# Patient Record
Sex: Male | Born: 1995 | Race: Black or African American | Hispanic: No | Marital: Single | Smoking: Former smoker
Health system: Southern US, Community
[De-identification: ages and names within clinical notes are randomized; demographics above are authoritative.]

## PROBLEM LIST (undated history)

## (undated) DIAGNOSIS — G47419 Narcolepsy without cataplexy: Secondary | ICD-10-CM

---
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
## 2017-01-19 ENCOUNTER — Emergency Department: Payer: Self-pay

## 2017-01-19 ENCOUNTER — Encounter: Payer: Self-pay | Admitting: Emergency Medicine

## 2017-01-19 ENCOUNTER — Emergency Department
Admission: EM | Admit: 2017-01-19 | Discharge: 2017-01-19 | Disposition: A | Discharge status: Home / Self Care | Payer: Self-pay | Attending: Emergency Medicine | Admitting: Emergency Medicine

## 2017-01-19 DIAGNOSIS — M546 Pain in thoracic spine: Secondary | ICD-10-CM | POA: Insufficient documentation

## 2017-01-19 DIAGNOSIS — F17210 Nicotine dependence, cigarettes, uncomplicated: Secondary | ICD-10-CM | POA: Insufficient documentation

## 2017-01-19 DIAGNOSIS — M545 Low back pain: Secondary | ICD-10-CM | POA: Insufficient documentation

## 2017-01-19 MED ORDER — NAPROXEN 500 MG PO TABS: 500.0000 mg | ORAL_TABLET | Freq: Two times a day (BID) | ORAL | 0 refills | Status: AC

## 2017-01-19 MED ORDER — CYCLOBENZAPRINE HCL 5 MG PO TABS: 5.0000 mg | ORAL_TABLET | Freq: Three times a day (TID) | ORAL | 0 refills | Status: DC | PRN

## 2017-01-19 MED ORDER — NAPROXEN 500 MG PO TABS
500.0000 mg | ORAL_TABLET | Freq: Once | ORAL | Status: AC
  Administered 2017-01-19: 500 mg via ORAL
  Filled 2017-01-19: qty 1

## 2017-01-19 NOTE — Discharge Instructions (Signed)
Your exam and x-rays are negative for any acute fracture or dislocation. You do have some mild scoliosis. 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 You should take the prescription meds as directed. Follow-up with your provider for continued symptoms.

## 2017-01-19 NOTE — ED Notes (Signed)
Pt states that yesterday while at work he lifted a heavy box out of the chiller, pt states that he has been having left sided back pain since, pt denies this being worker's comp at this time, pt denies pain radiating into his buttocks or leg. Pt states that when he bends forward he feels the pain in his left lower back, pt denies pain or issue with urinating. No distress noted at this time

## 2017-01-19 NOTE — ED Provider Notes (Signed)
[facility] Emergency Department Provider Note ____________________________________________  Time seen: 2121  I have reviewed the triage vital signs and the nursing notes.  HISTORY  Chief Complaint  Back Pain  HPI Stephen [name] is a 21 y.o. male Presents to the ED for evaluation of intermittent mid back pain has been persistent according to him "since I was little." He reports thoracic pain with lifting boxes at work. He denies any trauma, accident, falls, or contusions. He also denies any chest pain, shortness or breath, or fevers.   History reviewed. No pertinent past medical history.  There are no active problems to display for this patient.  History reviewed. No pertinent surgical history.  Prior to Admission medications   Medication Sig Start Date End Date Taking? Authorizing Provider  cyclobenzaprine (FLEXERIL) 5 MG tablet Take 1 tablet (5 mg total) by mouth 3 (three) times daily as needed for muscle spasms. 01/19/17   Ayeshia Coppin, Charlesetta Ivory, PA-C  naproxen (NAPROSYN) 500 MG tablet Take 1 tablet (500 mg total) by mouth 2 (two) times daily with a meal. 01/19/17 02/18/17  Lailie Smead, Charlesetta Ivory, PA-C    Allergies Patient has no known allergies.  History reviewed. No pertinent family history.  Social History Social History  Substance Use Topics  . Smoking status: Current Every Day Smoker  . Smokeless tobacco: Never Used  . Alcohol use No    Review of Systems  Constitutional: Negative for fever. Cardiovascular: Negative for chest pain. Respiratory: Negative for shortness of breath. Gastrointestinal: Negative for abdominal pain, vomiting and diarrhea. Genitourinary: Negative for dysuria. Musculoskeletal: Negative for back pain. Midback pain as above.  Skin: Negative for rash. Neurological: Negative for headaches, focal weakness or numbness. ____________________________________________  PHYSICAL EXAM:  VITAL SIGNS: ED Triage Vitals  [01/19/17 2005]  Enc Vitals Group     BP 115/60     Pulse Rate 88     Resp 16     Temp 98.6 F (37 C)     Temp Source Oral     SpO2 100 %     Weight 138 lb (62.6 kg)     Height  (1.727 m)     Head Circumference      Peak Flow      Pain Score 5     Pain Loc      Pain Edu?      Excl. in GC?     Constitutional: Alert and oriented. Well appearing and in no distress. Head: Normocephalic and atraumatic. Eyes: Conjunctivae are normal. Normal extraocular movements Neck: Supple. No thyromegaly. Cardiovascular: Normal rate, regular rhythm. Normal distal pulses. Respiratory: Normal respiratory effort. No wheezes/rales/rhonchi. Gastrointestinal: Soft and nontender. No distention. Musculoskeletal: Nontender midline spinal alignment with right convex curvature consistent with a thoracolumbar scoliosis. Mildly tender to palpation over left thoracolumbar musculature. No palpable spasm noted. Nontender with normal range of motion in all extremities.  Neurologic:  Normal gait without ataxia. Normal speech and language. No gross focal neurologic deficits are appreciated. Skin:  Skin is warm, dry and intact. No rash noted. ____________________________________________   RADIOLOGY  Thoracic Spine IMPRESSION: 1. Thoracic and lumbar curvature. 2. No acute findings or significant degenerative change.  I, Berniece Abid, Charlesetta Ivory, personally viewed and evaluated these images (plain radiographs) as part of my medical decision making, as well as reviewing the written report by the radiologist. ____________________________________________  PROCEDURES  Naproxen 500 mg PO ____________________________________________  INITIAL IMPRESSION / ASSESSMENT AND PLAN / ED COURSE  Patient  with ED evaluation of intermittent mid back pain and muscle strain. His symptoms have been evaluated by his work activities. No acute trauma suspected. His x-rays negative for any acute car pulmonary process. It does  reveal some thoracolumbar scoliosis as well as 2 well-inflated lungs. Patient will be discharged with a prescription for naproxen and Flexeril to dose as needed. He should follow-up with his primary care provider or local community clinic for ongoing symptom management. ____________________________________________  FINAL CLINICAL IMPRESSION(S) / ED DIAGNOSES  Final diagnoses:  Thoracolumbar back pain      Kaloni Bisaillon, Charlesetta Ivory, PA-C 01/19/17 2302    Dionne Bucy, MD 01/20/17 [id number]

## 2017-01-19 NOTE — ED Triage Notes (Signed)
Pt states he has had off and on back "problems since I was little". Pt states over last several days has had mid thoracic to low thoracic pain with lifting and then "stopping". Pt denies fever, dysuria, bowel or bladder difficulties. Pt appears in no acute distress and is ambulatory without difficulty.

## 2017-02-05 ENCOUNTER — Emergency Department
Admission: EM | Admit: 2017-02-05 | Discharge: 2017-02-05 | Disposition: A | Discharge status: Home / Self Care | Payer: Self-pay | Attending: Emergency Medicine | Admitting: Emergency Medicine

## 2017-02-05 ENCOUNTER — Encounter: Payer: Self-pay | Admitting: Emergency Medicine

## 2017-02-05 DIAGNOSIS — R112 Nausea with vomiting, unspecified: Secondary | ICD-10-CM | POA: Insufficient documentation

## 2017-02-05 DIAGNOSIS — R197 Diarrhea, unspecified: Secondary | ICD-10-CM | POA: Insufficient documentation

## 2017-02-05 DIAGNOSIS — Z5321 Procedure and treatment not carried out due to patient leaving prior to being seen by health care provider: Secondary | ICD-10-CM | POA: Insufficient documentation

## 2017-02-05 LAB — URINALYSIS, COMPLETE (UACMP) WITH MICROSCOPIC
BACTERIA UA: NONE SEEN
BILIRUBIN URINE: NEGATIVE
Glucose, UA: NEGATIVE mg/dL
HGB URINE DIPSTICK: NEGATIVE
Ketones, ur: NEGATIVE mg/dL
LEUKOCYTES UA: NEGATIVE
NITRITE: NEGATIVE
PROTEIN: NEGATIVE mg/dL
RBC / HPF: NONE SEEN RBC/hpf (ref 0–5)
Specific Gravity, Urine: 1.024 (ref 1.005–1.030)
pH: 7 (ref 5.0–8.0)

## 2017-02-05 LAB — CBC
HEMATOCRIT: 45.4 % (ref 40.0–52.0)
HEMOGLOBIN: 15.1 g/dL (ref 13.0–18.0)
MCH: 28.6 pg (ref 26.0–34.0)
MCHC: 33.3 g/dL (ref 32.0–36.0)
MCV: 86.1 fL (ref 80.0–100.0)
Platelets: 222 10*3/uL (ref 150–440)
RBC: 5.27 MIL/uL (ref 4.40–5.90)
RDW: 13.5 % (ref 11.5–14.5)
WBC: 8.3 10*3/uL (ref 3.8–10.6)

## 2017-02-05 LAB — COMPREHENSIVE METABOLIC PANEL
ALBUMIN: 4.5 g/dL (ref 3.5–5.0)
ALT: 20 U/L (ref 17–63)
ANION GAP: 7 (ref 5–15)
AST: 28 U/L (ref 15–41)
Alkaline Phosphatase: 61 U/L (ref 38–126)
BILIRUBIN TOTAL: 0.9 mg/dL (ref 0.3–1.2)
BUN: 14 mg/dL (ref 6–20)
CHLORIDE: 105 mmol/L (ref 101–111)
CO2: 29 mmol/L (ref 22–32)
Calcium: 9.5 mg/dL (ref 8.9–10.3)
Creatinine, Ser: 1.06 mg/dL (ref 0.61–1.24)
GFR calc Af Amer: >60 mL/min (ref >60)
GFR calc non Af Amer: >60 mL/min (ref >60)
GLUCOSE: 84 mg/dL (ref 65–99)
POTASSIUM: 3.9 mmol/L (ref 3.5–5.1)
SODIUM: 141 mmol/L (ref 135–145)
TOTAL PROTEIN: 8.1 g/dL (ref 6.5–8.1)

## 2017-02-05 LAB — LIPASE, BLOOD: LIPASE: 31 U/L (ref 11–51)

## 2017-02-05 NOTE — ED Triage Notes (Signed)
Pt c/o N/V/D x1 day. Pt reports he started to have symptoms after eating a burger from [employer] last night.

## 2017-02-05 NOTE — ED Notes (Signed)
Pt to front desk to inform that he is leaving due to having to get to work. Pt advised to stay and be seen. Pt refusing to stay.

## 2018-09-17 ENCOUNTER — Emergency Department
Admission: EM | Admit: 2018-09-17 | Discharge: 2018-09-17 | Disposition: A | Discharge status: Home / Self Care | Payer: Self-pay | Attending: Emergency Medicine | Admitting: Emergency Medicine

## 2018-09-17 ENCOUNTER — Other Ambulatory Visit: Payer: Self-pay

## 2018-09-17 ENCOUNTER — Emergency Department: Payer: Self-pay

## 2018-09-17 ENCOUNTER — Encounter: Payer: Self-pay | Admitting: *Deleted

## 2018-09-17 DIAGNOSIS — F17200 Nicotine dependence, unspecified, uncomplicated: Secondary | ICD-10-CM | POA: Insufficient documentation

## 2018-09-17 DIAGNOSIS — Y999 Unspecified external cause status: Secondary | ICD-10-CM | POA: Insufficient documentation

## 2018-09-17 DIAGNOSIS — Y929 Unspecified place or not applicable: Secondary | ICD-10-CM | POA: Insufficient documentation

## 2018-09-17 DIAGNOSIS — Y9389 Activity, other specified: Secondary | ICD-10-CM | POA: Insufficient documentation

## 2018-09-17 DIAGNOSIS — S6991XA Unspecified injury of right wrist, hand and finger(s), initial encounter: Secondary | ICD-10-CM | POA: Insufficient documentation

## 2018-09-17 MED ORDER — IBUPROFEN 600 MG PO TABS: 600.0000 mg | ORAL_TABLET | Freq: Four times a day (QID) | ORAL | 0 refills | Status: DC | PRN

## 2018-09-17 MED ORDER — KETOROLAC TROMETHAMINE 30 MG/ML IJ SOLN
30.0000 mg | Freq: Once | INTRAMUSCULAR | Status: AC
  Administered 2018-09-17: 30 mg via INTRAMUSCULAR
  Filled 2018-09-17: qty 1

## 2018-09-17 NOTE — ED Triage Notes (Signed)
Pt has pain in right wrist.  Pt was playing tug of war today and injured wrist   Pt alert

## 2018-09-17 NOTE — ED Provider Notes (Signed)
[facility] Emergency Department Provider Note  ____________________________________________  Time seen: Approximately 10:00 PM  I have reviewed the triage vital signs and the nursing notes.   HISTORY  Chief Complaint Wrist Pain    HPI Stephen [name] is a 23 y.o. male presents emergency department for evaluation of right wrist pain after injury today.  Patient was playing tug of war on his bike with a fan friend when he fell off of his bike.  He landed with his hands outstretched.  He has been having pain to his right wrist since.  He states that he occasionally gets some shooting pains from his wrist into his hands.  He has not taken anything for pain.  He did not hit his head.  No additional injuries.   No past medical history on file.  There are no active problems to display for this patient.   No past surgical history on file.  Prior to Admission medications   Medication Sig Start Date End Date Taking? Authorizing Provider  cyclobenzaprine (FLEXERIL) 5 MG tablet Take 1 tablet (5 mg total) by mouth 3 (three) times daily as needed for muscle spasms. 01/19/17   Menshew, Charlesetta IvoryJenise V Bacon, PA-C  ibuprofen (ADVIL) 600 MG tablet Take 1 tablet (600 mg total) by mouth every 6 (six) hours as needed. 09/17/18   Enid DerryWagner, Lizzy Hamre, PA-C    Allergies Patient has no known allergies.  No family history on file.  Social History Social History   Tobacco Use  . Smoking status: Current Every Day Smoker  . Smokeless tobacco: Never Used  Substance Use Topics  . Alcohol use: No  . Drug use: No     Review of Systems  Respiratory: No SOB. Gastrointestinal:  No nausea, no vomiting.  Musculoskeletal: Positive for wrist pain. Skin: Negative for rash, abrasions, lacerations, ecchymosis.   ____________________________________________   PHYSICAL EXAM:  VITAL SIGNS: ED Triage Vitals  Enc Vitals Group     BP 09/17/18 1841 116/74     Pulse Rate 09/17/18 1841 (!)  109     Resp 09/17/18 1841 18     Temp 09/17/18 1841 98.8 F (37.1 C)     Temp Source 09/17/18 1841 Oral     SpO2 09/17/18 1841 99 %     Weight 09/17/18 1842 150 lb (68 kg)     Height 09/17/18 1842 5\' 8"  (1.727 m)     Head Circumference --      Peak Flow --      Pain Score 09/17/18 1841 9     Pain Loc --      Pain Edu? --      Excl. in GC? --      Constitutional: Alert and oriented. Well appearing and in no acute distress.  Patient eating graham crackers and peanut butter. Eyes: Conjunctivae are normal. PERRL. EOMI. Head: Atraumatic. ENT:      Ears:      Nose: No congestion/rhinnorhea.      Mouth/Throat: Mucous membranes are moist.  Neck: No stridor.   Cardiovascular: Normal rate, regular rhythm.  Good peripheral circulation.  Symmetric radial pulses bilaterally.  Cap refill less than 2 seconds. Respiratory: Normal respiratory effort without tachypnea or retractions. Lungs CTAB. Good air entry to the bases with no decreased or absent breath sounds. Musculoskeletal: Full range of motion to all extremities. No gross deformities appreciated.  Full range of motion of right wrist.  No visible swelling.  No pinpoint tenderness to palpation. Neurologic:  Normal  speech and language. No gross focal neurologic deficits are appreciated.  Skin:  Skin is warm, dry and intact. No rash noted. Psychiatric: Mood and affect are normal. Speech and behavior are normal. Patient exhibits appropriate insight and judgement.   ____________________________________________   LABS (all labs ordered are listed, but only abnormal results are displayed)  Labs Reviewed - No data to display ____________________________________________  EKG   ____________________________________________  RADIOLOGY Lexine Baton, personally viewed and evaluated these images (plain radiographs) as part of my medical decision making, as well as reviewing the written report by the radiologist.  Dg Wrist Complete  Right  Result Date: 09/17/2018 CLINICAL DATA:  Wrist pain following tug of war, initial encounter EXAM: RIGHT WRIST - COMPLETE 3+ VIEW COMPARISON:  None. FINDINGS: There is no evidence of fracture or dislocation. There is no evidence of arthropathy or other focal bone abnormality. Soft tissues are unremarkable. IMPRESSION: No acute abnormality noted. Electronically Signed   By: Alcide Clever M.D.   On: 09/17/2018 20:41    ____________________________________________    PROCEDURES  Procedure(s) performed:    Procedures    Medications  ketorolac (TORADOL) 30 MG/ML injection 30 mg (30 mg Intramuscular Given 09/17/18 2110)     ____________________________________________   INITIAL IMPRESSION / ASSESSMENT AND PLAN / ED COURSE  Pertinent labs & imaging results that were available during my care of the patient were reviewed by me and considered in my medical decision making (see chart for details).  Review of the [place] CSRS was performed in accordance of the [facility] prior to dispensing any controlled drugs.     Patient presents emergency department for evaluation of right wrist pain after injury today.  Vital signs and exam are reassuring.  X-ray negative for acute bony abnormalities.  Velcro wrist splint was applied.  IM Toradol was given for pain.  Patient will be discharged home with prescriptions for Motrin. Patient is to follow up with primary care as directed. Patient is given ED precautions to return to the ED for any worsening or new symptoms.     ____________________________________________  FINAL CLINICAL IMPRESSION(S) / ED DIAGNOSES  Final diagnoses:  Injury of right wrist, initial encounter      NEW MEDICATIONS STARTED DURING THIS VISIT:  ED Discharge Orders         Ordered    ibuprofen (ADVIL) 600 MG tablet  Every 6 hours PRN     09/17/18 2057              This chart was dictated using voice recognition software/Dragon. Despite best efforts to proofread,  errors can occur which can change the meaning. Any change was purely unintentional.    Enid Derry, PA-C 09/17/18 2300    Jene Every, MD 09/17/18 2308

## 2018-09-17 NOTE — Discharge Instructions (Signed)
There are no broken bones in your wrist.  Please ice and elevate wrist tonight.  You were given a shot of pain medication in the emergency department.  Tomorrow, begin ibuprofen for pain and inflammation.  Please wear Velcro wrist splint.  Follow-up with primary care next week for reevaluation.

## 2018-11-06 ENCOUNTER — Other Ambulatory Visit: Payer: Self-pay

## 2018-11-06 DIAGNOSIS — Z20822 Contact with and (suspected) exposure to covid-19: Secondary | ICD-10-CM

## 2018-11-10 LAB — NOVEL CORONAVIRUS, NAA: SARS-CoV-2, NAA: NOT DETECTED

## 2019-02-01 ENCOUNTER — Ambulatory Visit: Payer: Self-pay

## 2019-02-02 ENCOUNTER — Encounter: Payer: Self-pay | Admitting: Physician Assistant

## 2019-02-02 ENCOUNTER — Ambulatory Visit: Payer: Self-pay | Admitting: Physician Assistant

## 2019-02-02 ENCOUNTER — Other Ambulatory Visit: Payer: Self-pay

## 2019-02-02 DIAGNOSIS — Z202 Contact with and (suspected) exposure to infections with a predominantly sexual mode of transmission: Secondary | ICD-10-CM

## 2019-02-02 DIAGNOSIS — Z113 Encounter for screening for infections with a predominantly sexual mode of transmission: Secondary | ICD-10-CM

## 2019-02-02 LAB — GRAM STAIN

## 2019-02-02 MED ORDER — AZITHROMYCIN 500 MG PO TABS
1000.0000 mg | ORAL_TABLET | Freq: Once | ORAL | Status: AC
  Administered 2019-02-02: 1000 mg via ORAL

## 2019-02-02 NOTE — Progress Notes (Signed)
    STI clinic/screening visit  Subjective:  Stephen [name] is a 23 y.o. male being seen today for an STI screening visit. The patient reports they do have symptoms.  Patient has the following medical conditions:  There are no active problems to display for this patient.    Chief Complaint  Patient presents with  . SEXUALLY TRANSMITTED DISEASE    HPI  Patient reports that he is a contact to Chlamydia.  States that he has had some grayish discharge for a day or so and "feels funny" when he urinates.  See flowsheet for further details and programmatic requirements.    The following portions of the patient's history were reviewed and updated as appropriate: allergies, current medications, past medical history, past social history, past surgical history and problem list.  Objective:  There were no vitals filed for this visit.  Physical Exam Constitutional:      General: He is not in acute distress.    Appearance: Normal appearance.  HENT:     Head: Normocephalic and atraumatic.     Mouth/Throat:     Mouth: Mucous membranes are moist.     Pharynx: Oropharynx is clear. No oropharyngeal exudate or posterior oropharyngeal erythema.  Eyes:     Conjunctiva/sclera: Conjunctivae normal.  Neck:     Musculoskeletal: Neck supple.  Pulmonary:     Effort: Pulmonary effort is normal.  Abdominal:     Palpations: Abdomen is soft. There is no mass.     Tenderness: There is no abdominal tenderness. There is no guarding or rebound.  Genitourinary:    Penis: Normal.      Scrotum/Testes: Normal.     Comments: Pubic area without nits, lice, edema, erythema, lesions, and inguinal adenopathy. Penis circumcised and without discharge from the meatus. Lymphadenopathy:     Cervical: No cervical adenopathy.  Skin:    General: Skin is warm and dry.     Findings: No bruising, erythema, lesion or rash.  Neurological:     Mental Status: He is alert and oriented to person, place, and time.   Psychiatric:        Mood and Affect: Mood normal.        Behavior: Behavior normal.        Thought Content: Thought content normal.        Judgment: Judgment normal.       Assessment and Plan:  Stephen [name] is a 23 y.o. male presenting to the [facility] Department for STI screening  1. Screening for STD (sexually transmitted disease) Patient with symptoms.   Rec condoms with all sex. Await test results.  Counseled that RN will call if needs to RTC for further treatment once results are back. - Gram stain - Gonococcus culture - HIV [facility] - Syphilis Serology, [facility] - Gonococcus culture  2. Exposure to chlamydia Patient to be treated as a contact to Chlamydia with Azithromycin 1g po DOT today. No sex for 7 days and until after partner completes treatment. RTC if vomits < 2 hr after completing medicine for re-treatment. - azithromycin (ZITHROMAX) tablet 1,000 mg     No follow-ups on file.  No future appointments.  Jerene Dilling, PA

## 2019-02-05 NOTE — Progress Notes (Signed)
Gram stain reviewed and pt treated for NGU and contact to Chlamydia per SO and per Antoine Primas, PA order with Azithromycin 1g po DOT. Provider orders completed.Ronny Bacon, RN

## 2019-02-07 LAB — GONOCOCCUS CULTURE

## 2019-02-22 ENCOUNTER — Encounter: Payer: Self-pay | Admitting: Emergency Medicine

## 2019-02-22 ENCOUNTER — Other Ambulatory Visit: Payer: Self-pay

## 2019-02-22 DIAGNOSIS — Z20828 Contact with and (suspected) exposure to other viral communicable diseases: Secondary | ICD-10-CM | POA: Insufficient documentation

## 2019-02-22 DIAGNOSIS — R05 Cough: Secondary | ICD-10-CM | POA: Insufficient documentation

## 2019-02-22 NOTE — ED Triage Notes (Signed)
Patient ambulatory to triage with steady gait, without difficulty or distress noted, mask in place; pt reports sore throat & congestion x wk; denies fever

## 2019-02-23 ENCOUNTER — Emergency Department
Admission: EM | Admit: 2019-02-23 | Discharge: 2019-02-23 | Disposition: A | Discharge status: Home / Self Care | Payer: Self-pay | Attending: Emergency Medicine | Admitting: Emergency Medicine

## 2019-02-23 ENCOUNTER — Emergency Department: Payer: Self-pay

## 2019-02-23 DIAGNOSIS — R05 Cough: Secondary | ICD-10-CM

## 2019-02-23 DIAGNOSIS — Z20822 Contact with and (suspected) exposure to covid-19: Secondary | ICD-10-CM

## 2019-02-23 DIAGNOSIS — R059 Cough, unspecified: Secondary | ICD-10-CM

## 2019-02-23 LAB — SARS CORONAVIRUS 2 (TAT 6-24 HRS): SARS Coronavirus 2: NEGATIVE

## 2019-02-23 MED ORDER — PREDNISONE 20 MG PO TABS: 60.0000 mg | ORAL_TABLET | Freq: Every day | ORAL | 0 refills | Status: AC

## 2019-02-23 MED ORDER — PREDNISONE 20 MG PO TABS
60.0000 mg | ORAL_TABLET | Freq: Once | ORAL | Status: AC
  Administered 2019-02-23: 60 mg via ORAL
  Filled 2019-02-23: qty 3

## 2019-02-23 NOTE — Discharge Instructions (Signed)

## 2019-02-23 NOTE — ED Provider Notes (Signed)
[facility] Emergency Department Provider Note  ____________________________________________  Time seen: Approximately 1:11 AM  I have reviewed the triage vital signs and the nursing notes.   HISTORY  Chief Complaint Sore Throat   HPI STYLIANOS STRADLING is a 23 y.o. male no significant past medical history who presents for evaluation of cough and sore throat.  Patient reports a week of congestion, cough productive of clear sputum, sore throat, bilateral earaches.  No body aches, no fever, no chest pain, no shortness of breath, no vomiting, no diarrhea.  Patient works in [employer] but has had no known exposures to Covid.  He is concerned about Covid.  He is a smoker.  Also smokes marijuana.  Denies any drug use.  His symptoms are mild and constant.   PMH ADHD (attention deficit hyperactivity disorder)   Mild mental retardation      Prior to Admission medications   Medication Sig Start Date End Date Taking? Authorizing Provider  cyclobenzaprine (FLEXERIL) 5 MG tablet Take 1 tablet (5 mg total) by mouth 3 (three) times daily as needed for muscle spasms. 01/19/17   Menshew, Dannielle Karvonen, PA-C  ibuprofen (ADVIL) 600 MG tablet Take 1 tablet (600 mg total) by mouth every 6 (six) hours as needed. 09/17/18   Laban Emperor, PA-C  predniSONE (DELTASONE) 20 MG tablet Take 3 tablets (60 mg total) by mouth daily for 4 days. 02/23/19 02/27/19  Rudene Re, MD    Allergies Patient has no known allergies.  No family history on file.  Social History Smoking - current Drugs - MJ Alcohol - no  Review of Systems  Constitutional: Negative for fever. Eyes: Negative for visual changes. ENT: + sore throat, b/l ear pain Neck: No neck pain  Cardiovascular: Negative for chest pain. Respiratory: Negative for shortness of breath. + cough Gastrointestinal: Negative for abdominal pain, vomiting or diarrhea. Genitourinary: Negative for dysuria. Musculoskeletal:  Negative for back pain. Skin: Negative for rash. Neurological: Negative for headaches, weakness or numbness. Psych: No SI or HI  ____________________________________________   PHYSICAL EXAM:  VITAL SIGNS: ED Triage Vitals [02/22/19 2350]  Enc Vitals Group     BP 120/72     Pulse Rate 80     Resp 18     Temp 98.5 F (36.9 C)     Temp Source Oral     SpO2 99 %     Weight 149 lb (67.6 kg)     Height 5\' 8"  (1.727 m)     Head Circumference      Peak Flow      Pain Score 8     Pain Loc      Pain Edu?      Excl. in [place]?     Constitutional: Alert and oriented. Well appearing and in no apparent distress. HEENT:      Head: Normocephalic and atraumatic.         Eyes: Conjunctivae are normal. Sclera is non-icteric.       Mouth/Throat: Mucous membranes are moist.  Oropharynx is clear with no exudates, no erythema, no swelling.      Ears: Bilateral TMs are visualized and normal with no exudate.      Neck: Supple with no signs of meningismus. Cardiovascular: Regular rate and rhythm. No murmurs, gallops, or rubs. 2+ symmetrical distal pulses are present in all extremities. No JVD. Respiratory: Normal respiratory effort. Lungs are clear to auscultation bilaterally. No wheezes, crackles, or rhonchi.  Gastrointestinal: Soft, non tender,  and non distended with positive bowel sounds. No rebound or guarding. Musculoskeletal: Nontender with normal range of motion in all extremities. No edema, cyanosis, or erythema of extremities. Neurologic: Normal speech and language. Face is symmetric. Moving all extremities. No gross focal neurologic deficits are appreciated. Skin: Skin is warm, dry and intact. No rash noted. Psychiatric: Mood and affect are normal. Speech and behavior are normal.  ____________________________________________   LABS (all labs ordered are listed, but only abnormal results are displayed)  Labs Reviewed  SARS CORONAVIRUS 2 (TAT 6-24 HRS)    ____________________________________________  EKG  I have personally reviewed the images performed during this visit and I agree with the Radiologist's read.   Interpretation by Radiologist:  Dg Chest Portable 1 View  Result Date: 02/23/2019 CLINICAL DATA:  Cough EXAM: PORTABLE CHEST 1 VIEW COMPARISON:  None. FINDINGS: The heart size and mediastinal contours are within normal limits. Both lungs are clear. The visualized skeletal structures are unremarkable. IMPRESSION: No active disease. Electronically Signed   By: Katherine Mantle M.D.   On: 02/23/2019 01:22      ____________________________________________  RADIOLOGY    ____________________________________________   PROCEDURES  Procedure(s) performed: None Procedures Critical Care performed:  None ____________________________________________   INITIAL IMPRESSION / ASSESSMENT AND PLAN / ED COURSE  23 y.o. male no significant past medical history who presents for evaluation of cough, sore throat and earache x1 week.  Patient is concerned that he is Covid positive.  Will swab him for Covid.  Patient with normal work of breathing, normal sats at rest and ambulation, lungs are clear to auscultation, no shortness of breath or chest pain.  Chest x-ray negative for pneumonia.  Discussed quarantine and home care until results of Covid are back and also discussed quarantine and home care if the results are positive. Will dc patient on prednisone for possible bronchitis with cough on a smoker.        As part of my medical decision making, I reviewed the following data within the electronic MEDICAL RECORD NUMBER Nursing notes reviewed and incorporated, Old chart reviewed, Radiograph reviewed , Notes from prior ED visits and [place] Controlled Substance Database   Patient was evaluated in Emergency Department today for the symptoms described in the history of present illness. Patient was evaluated in the context of the global COVID-19  pandemic, which necessitated consideration that the patient might be at risk for infection with the SARS-CoV-2 virus that causes COVID-19. Institutional protocols and algorithms that pertain to the evaluation of patients at risk for COVID-19 are in a state of rapid change based on information released by regulatory bodies including the CDC and federal and state organizations. These policies and algorithms were followed during the patient's care in the ED.   ____________________________________________   FINAL CLINICAL IMPRESSION(S) / ED DIAGNOSES   Final diagnoses:  Cough  Suspected COVID-19 virus infection      NEW MEDICATIONS STARTED DURING THIS VISIT:  ED Discharge Orders         Ordered    predniSONE (DELTASONE) 20 MG tablet  Daily     02/23/19 0134           Note:  This document was prepared using Dragon voice recognition software and may include unintentional dictation errors.    Don Perking, [place], MD 02/23/19 [phone]

## 2019-02-23 NOTE — ED Notes (Signed)
Patient discharged to home per MD order. Patient in stable condition, and deemed medically cleared by ED provider for discharge. Discharge instructions reviewed with patient/family using "Teach Back"; verbalized understanding of medication education and administration, and information about follow-up care. Denies further concerns. ° °

## 2019-03-16 ENCOUNTER — Other Ambulatory Visit: Payer: Self-pay

## 2019-03-16 ENCOUNTER — Emergency Department
Admission: EM | Admit: 2019-03-16 | Discharge: 2019-03-16 | Disposition: A | Discharge status: Home / Self Care | Payer: Self-pay | Attending: Emergency Medicine | Admitting: Emergency Medicine

## 2019-03-16 DIAGNOSIS — Z7689 Persons encountering health services in other specified circumstances: Secondary | ICD-10-CM | POA: Insufficient documentation

## 2019-03-16 DIAGNOSIS — F17200 Nicotine dependence, unspecified, uncomplicated: Secondary | ICD-10-CM | POA: Insufficient documentation

## 2019-03-16 NOTE — ED Triage Notes (Signed)
Pt reports he went to work this am and got hot and had a pain hit him in the abdomen. Pt reports that he feels fine now and was told to come get a work note. Pt denies pain. In no distress.

## 2019-03-16 NOTE — ED Provider Notes (Signed)
[facility] Emergency Department Provider Note  ____________________________________________  Time seen: Approximately 6:41 PM  I have reviewed the triage vital signs and the nursing notes.   HISTORY  Chief Complaint Nausea and Letter for School/Work    HPI Stephen [name] is a 23 y.o. male presents to the emergency department for a return to work note.  Patient states that he became nauseated at work yesterday and had to leave early.  His employer is requiring a return to work note.  He reports that he does not currently feel sick and has had no nausea or vomiting.  He has been afebrile.  No rhinorrhea, nasal congestion or nonproductive cough.        No past medical history on file.  There are no active problems to display for this patient.   No past surgical history on file.  Prior to Admission medications   Medication Sig Start Date End Date Taking? Authorizing Provider  cyclobenzaprine (FLEXERIL) 5 MG tablet Take 1 tablet (5 mg total) by mouth 3 (three) times daily as needed for muscle spasms. 01/19/17   Menshew, Dannielle Karvonen, PA-C  ibuprofen (ADVIL) 600 MG tablet Take 1 tablet (600 mg total) by mouth every 6 (six) hours as needed. 09/17/18   Laban Emperor, PA-C    Allergies Patient has no known allergies.  No family history on file.  Social History Social History   Tobacco Use  . Smoking status: Current Every Day Smoker  . Smokeless tobacco: Never Used  Substance Use Topics  . Alcohol use: No  . Drug use: No     Review of Systems  Constitutional: No fever/chills Eyes: No visual changes. No discharge ENT: No upper respiratory complaints. Cardiovascular: no chest pain. Respiratory: no cough. No SOB. Gastrointestinal: No abdominal pain.  No nausea, no vomiting.  No diarrhea.  No constipation. Genitourinary: Negative for dysuria. No hematuria Musculoskeletal: Negative for musculoskeletal pain. Skin: Negative for rash, abrasions,  lacerations, ecchymosis. Neurological: Negative for headaches, focal weakness or numbness. ____________________________________________   PHYSICAL EXAM:  VITAL SIGNS: ED Triage Vitals  Enc Vitals Group     BP 03/16/19 1420 118/71     Pulse Rate 03/16/19 1420 (!) 110     Resp 03/16/19 1420 16     Temp 03/16/19 1420 98.9 F (37.2 C)     Temp Source 03/16/19 1420 Oral     SpO2 03/16/19 1420 97 %     Weight 03/16/19 1421 150 lb (68 kg)     Height 03/16/19 1421 5\' 8"  (1.727 m)     Head Circumference --      Peak Flow --      Pain Score 03/16/19 1421 0     Pain Loc --      Pain Edu? --      Excl. in [place]? --      Constitutional: Alert and oriented. Well appearing and in no acute distress. Eyes: Conjunctivae are normal. PERRL. EOMI. Head: Atraumatic. ENT:      Nose: No congestion/rhinnorhea.      Mouth/Throat: Mucous membranes are moist.  Neck: No stridor.  No cervical spine tenderness to palpation. Cardiovascular: Normal rate, regular rhythm. Normal S1 and S2.  Good peripheral circulation. Respiratory: Normal respiratory effort without tachypnea or retractions. Lungs CTAB. Good air entry to the bases with no decreased or absent breath sounds. Gastrointestinal: Bowel sounds 4 quadrants. Soft and nontender to palpation. No guarding or rigidity. No palpable masses. No distention. No CVA tenderness.  Musculoskeletal: Full range of motion to all extremities. No gross deformities appreciated. Neurologic:  Normal speech and language. No gross focal neurologic deficits are appreciated.  Skin:  Skin is warm, dry and intact. No rash noted. Psychiatric: Mood and affect are normal. Speech and behavior are normal. Patient exhibits appropriate insight and judgement.   ____________________________________________   LABS (all labs ordered are listed, but only abnormal results are displayed)  Labs Reviewed - No data to  display ____________________________________________  EKG   ____________________________________________  RADIOLOGY   No results found.  ____________________________________________    PROCEDURES  Procedure(s) performed:    Procedures    Medications - No data to display   ____________________________________________   INITIAL IMPRESSION / ASSESSMENT AND PLAN / ED COURSE  Pertinent labs & imaging results that were available during my care of the patient were reviewed by me and considered in my medical decision making (see chart for details).  Review of the [place] CSRS was performed in accordance of the [facility] prior to dispensing any controlled drugs.           Assessment and plan Return to work evaluation 23 year old male presents to the emergency department for a return to work note.  Request was granted.  All patient questions were answered.     ____________________________________________  FINAL CLINICAL IMPRESSION(S) / ED DIAGNOSES  Final diagnoses:  Return to work evaluation      NEW MEDICATIONS STARTED DURING THIS VISIT:  ED Discharge Orders    None          This chart was dictated using voice recognition software/Dragon. Despite best efforts to proofread, errors can occur which can change the meaning. Any change was purely unintentional.    Orvil Feil, PA-C 03/16/19 Cyndie Chime, MD 03/16/19 [phone]

## 2019-06-24 ENCOUNTER — Other Ambulatory Visit: Payer: Self-pay

## 2019-06-24 ENCOUNTER — Encounter: Payer: Self-pay | Admitting: Intensive Care

## 2019-06-24 ENCOUNTER — Emergency Department: Payer: No Typology Code available for payment source

## 2019-06-24 ENCOUNTER — Emergency Department
Admission: EM | Admit: 2019-06-24 | Discharge: 2019-06-24 | Disposition: A | Discharge status: Home / Self Care | Payer: No Typology Code available for payment source | Attending: Emergency Medicine | Admitting: Emergency Medicine

## 2019-06-24 DIAGNOSIS — Y9389 Activity, other specified: Secondary | ICD-10-CM | POA: Insufficient documentation

## 2019-06-24 DIAGNOSIS — M25562 Pain in left knee: Secondary | ICD-10-CM | POA: Insufficient documentation

## 2019-06-24 DIAGNOSIS — M545 Low back pain: Secondary | ICD-10-CM | POA: Diagnosis not present

## 2019-06-24 DIAGNOSIS — Y92410 Unspecified street and highway as the place of occurrence of the external cause: Secondary | ICD-10-CM | POA: Diagnosis not present

## 2019-06-24 DIAGNOSIS — Y999 Unspecified external cause status: Secondary | ICD-10-CM | POA: Diagnosis not present

## 2019-06-24 DIAGNOSIS — F17210 Nicotine dependence, cigarettes, uncomplicated: Secondary | ICD-10-CM | POA: Insufficient documentation

## 2019-06-24 DIAGNOSIS — M542 Cervicalgia: Secondary | ICD-10-CM | POA: Diagnosis not present

## 2019-06-24 HISTORY — DX: Narcolepsy without cataplexy: G47.419

## 2019-06-24 MED ORDER — MELOXICAM 15 MG PO TABS: 15.0000 mg | ORAL_TABLET | Freq: Every day | ORAL | 1 refills | Status: AC

## 2019-06-24 MED ORDER — METHOCARBAMOL 500 MG PO TABS: 500.0000 mg | ORAL_TABLET | Freq: Three times a day (TID) | ORAL | 0 refills | Status: AC | PRN
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
## 2019-06-24 NOTE — ED Triage Notes (Signed)
EMS vitals 130/85 b/p, 86HR, 95% RA
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
## 2019-08-30 ENCOUNTER — Encounter: Payer: Self-pay | Admitting: Physician Assistant

## 2019-08-30 ENCOUNTER — Ambulatory Visit: Payer: Self-pay | Admitting: Physician Assistant

## 2019-08-30 ENCOUNTER — Other Ambulatory Visit: Payer: Self-pay

## 2019-08-30 DIAGNOSIS — Z792 Long term (current) use of antibiotics: Secondary | ICD-10-CM

## 2019-08-30 DIAGNOSIS — Z113 Encounter for screening for infections with a predominantly sexual mode of transmission: Secondary | ICD-10-CM

## 2019-08-30 LAB — GRAM STAIN

## 2019-08-30 MED ORDER — AZITHROMYCIN 500 MG PO TABS
1000.0000 mg | ORAL_TABLET | Freq: Once | ORAL | Status: AC
  Administered 2019-08-30: 1000 mg via ORAL

## 2019-08-30 NOTE — Progress Notes (Signed)
Patient gram stain reviewed with provider, patient treated with azithromycin per provider orders. Patient counseled that [facility] will call if any abnormal test results, and that patient should call back if symptoms return. Patient states understanding.Burt Knack, RN

## 2019-08-30 NOTE — Progress Notes (Signed)
Patient here for STD testing.Messiah Ahr Brewer-Jensen, RN 

## 2019-08-30 NOTE — Progress Notes (Signed)
[facility] Department STI clinic/screening visit  Subjective:  Stephen [name] is a 24 y.o. male being seen today for an STI screening visit. The patient reports they do have symptoms.    Patient has the following medical conditions:  There are no problems to display for this patient.    Chief Complaint  Patient presents with  . SEXUALLY TRANSMITTED DISEASE    screening    HPI  Patient reports that he had a small amount of yellowish/whitish discharge from his penis on 08/27/2019.  States that he has not seen any further discharge, but that "it feels like icy/hot down there," both internally and externally.  Denies other symptoms, chronic conditions, history of surgery and regular medications.  Patient last voided about 45 min prior to exam.    See flowsheet for further details and programmatic requirements.    The following portions of the patient's history were reviewed and updated as appropriate: allergies, current medications, past medical history, past social history, past surgical history and problem list.  Objective:  There were no vitals filed for this visit.  Physical Exam Constitutional:      General: He is not in acute distress.    Appearance: Normal appearance.  HENT:     Head: Normocephalic and atraumatic.     Comments: No nits, lice, or hair loss. No cervical, supraclavicular or axillary adenopathy.    Mouth/Throat:     Mouth: Mucous membranes are moist.     Pharynx: Oropharynx is clear. No oropharyngeal exudate or posterior oropharyngeal erythema.  Eyes:     Conjunctiva/sclera: Conjunctivae normal.  Pulmonary:     Effort: Pulmonary effort is normal.  Abdominal:     Palpations: Abdomen is soft. There is no mass.     Tenderness: There is no abdominal tenderness. There is no guarding or rebound.  Genitourinary:    Penis: Normal.      Testes: Normal.     Comments: Pubic area without nits, lice, edema, erythema, lesions and inguinal  adenopathy. Penis circumcised and without rash or lesions.  No discharge visualized at meatus on exam. Musculoskeletal:     Cervical back: Neck supple. No tenderness.  Skin:    General: Skin is warm and dry.     Findings: No bruising, erythema, lesion or rash.  Neurological:     Mental Status: He is alert and oriented to person, place, and time.  Psychiatric:        Mood and Affect: Mood normal.        Behavior: Behavior normal.        Thought Content: Thought content normal.        Judgment: Judgment normal.       Assessment and Plan:  Stephen [name] is a 24 y.o. male presenting to the [facility] Department for STI screening  1. Screening for STD (sexually transmitted disease) Patient into clinic with symptoms. Rec condoms with all sex. Await test results.  Counseled that RN will call if needs to RTC for further treatment once results are back.  - Gram stain - Gonococcus culture - HIV [facility] - Syphilis Serology, [facility]  2. Prophylactic antibiotic Due to patient symptoms will treat with Azithromycin 1g po  DOT today. No sex for 7 days and until after partner has been evaluated and treated. RTC for re-treatment if vomits < 2 hr after taking medicine.  - azithromycin (ZITHROMAX) tablet 1,000 mg     No follow-ups on file.  No future appointments.  Jerene Dilling, PA

## 2019-09-04 LAB — GONOCOCCUS CULTURE
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
## 2020-09-26 ENCOUNTER — Emergency Department
Admission: EM | Admit: 2020-09-26 | Discharge: 2020-09-26 | Disposition: A | Discharge status: Home / Self Care | Payer: Self-pay | Attending: Emergency Medicine | Admitting: Emergency Medicine

## 2020-09-26 ENCOUNTER — Other Ambulatory Visit: Payer: Self-pay

## 2020-09-26 ENCOUNTER — Encounter: Payer: Self-pay | Admitting: Emergency Medicine

## 2020-09-26 DIAGNOSIS — G44209 Tension-type headache, unspecified, not intractable: Secondary | ICD-10-CM | POA: Insufficient documentation

## 2020-09-26 DIAGNOSIS — F17210 Nicotine dependence, cigarettes, uncomplicated: Secondary | ICD-10-CM | POA: Insufficient documentation

## 2020-09-26 DIAGNOSIS — R5383 Other fatigue: Secondary | ICD-10-CM | POA: Insufficient documentation

## 2020-09-26 DIAGNOSIS — M545 Low back pain, unspecified: Secondary | ICD-10-CM | POA: Insufficient documentation

## 2020-09-26 MED ORDER — KETOROLAC TROMETHAMINE 60 MG/2ML IM SOLN
30.0000 mg | Freq: Once | INTRAMUSCULAR | Status: AC
  Administered 2020-09-26: 30 mg via INTRAMUSCULAR
  Filled 2020-09-26: qty 2

## 2020-09-26 MED ORDER — NAPROXEN 500 MG PO TABS: 500.0000 mg | ORAL_TABLET | Freq: Two times a day (BID) | ORAL | 0 refills | Status: AC

## 2020-09-26 NOTE — ED Triage Notes (Signed)
C/O headache and back pain x 2 days.  Also c/o fatigue and sweating when sleeping.  AAAOx3.  Skin warm and dry. NAD

## 2020-09-26 NOTE — ED Notes (Signed)
See triage note  Presents with 2-3 days no fever no n/v  States min to relief with tylenol

## 2020-09-26 NOTE — ED Provider Notes (Signed)
[facility] Emergency Department Provider Note   ____________________________________________   Event Date/Time   First MD Initiated Contact with Patient 09/26/20 1059     (approximate)  I have reviewed the triage vital signs and the nursing notes.   HISTORY  Chief Complaint Headache    HPI Stephen [name] is a 25 y.o. male with no significant past medical history who presents to the ED complaining of headache.  Patient reports that he has had gradually worsening throbbing headache starting near both temples for the past 2 days.  He states it will radiate around both sides towards the back of his head, and is not exacerbated or alleviated by anything.  He denies any associated nausea, vomiting, or photophobia.  He has not had any vision changes, speech changes, numbness, or weakness.  He denies any fevers or neck stiffness.  He denies any history of similar headaches, took Tylenol yesterday without significant relief.  He does state that he has been feeling slightly fatigued with some soreness in his lower back, denies any trauma to his neck or back.        Past Medical History:  Diagnosis Date  . Narcolepsy     There are no problems to display for this patient.   History reviewed. No pertinent surgical history.  Prior to Admission medications   Medication Sig Start Date End Date Taking? Authorizing Provider  naproxen (NAPROSYN) 500 MG tablet Take 1 tablet (500 mg total) by mouth 2 (two) times daily with a meal for 6 days. 09/26/20 10/02/20 Yes Chesley Noon, MD    Allergies Patient has no known allergies.  No family history on file.  Social History Social History   Tobacco Use  . Smoking status: Current Every Day Smoker    Packs/day: 0.25    Years: 6.00    Pack years: 1.50    Types: Cigarettes  . Smokeless tobacco: Never Used  Vaping Use  . Vaping Use: Some days  Substance Use Topics  . Alcohol use: Yes  . Drug use: Yes    Types:  Marijuana    Review of Systems  Constitutional: No fever/chills Eyes: No visual changes. ENT: No sore throat. Cardiovascular: Denies chest pain. Respiratory: Denies shortness of breath. Gastrointestinal: No abdominal pain.  No nausea, no vomiting.  No diarrhea.  No constipation. Genitourinary: Negative for dysuria. Musculoskeletal: Positive for back pain. Skin: Negative for rash. Neurological: Positive for headaches, negative for focal weakness or numbness.  ____________________________________________   PHYSICAL EXAM:  VITAL SIGNS: ED Triage Vitals  Enc Vitals Group     BP 09/26/20 1052 113/78     Pulse Rate 09/26/20 1052 89     Resp 09/26/20 1051 16     Temp 09/26/20 1051 98.8 F (37.1 C)     Temp Source 09/26/20 1051 Oral     SpO2 09/26/20 1052 97 %     Weight 09/26/20 1051 175 lb 0.7 oz (79.4 kg)     Height 09/26/20 1051 6' (1.829 m)     Head Circumference --      Peak Flow --      Pain Score 09/26/20 1051 10     Pain Loc --      Pain Edu? --      Excl. in GC? --     Constitutional: Alert and oriented. Eyes: Conjunctivae are normal.  Pupils equal round and reactive to light bilaterally. Head: Atraumatic. Nose: No congestion/rhinnorhea. Mouth/Throat: Mucous membranes are moist. Neck: Normal  ROM Cardiovascular: Normal rate, regular rhythm. Grossly normal heart sounds. Respiratory: Normal respiratory effort.  No retractions. Lungs CTAB. Gastrointestinal: Soft and nontender. No distention. Genitourinary: deferred Musculoskeletal: No lower extremity tenderness nor edema.  No midline cervical, thoracic, or lumbar spinal tenderness to palpation. Neurologic:  Normal speech and language. No gross focal neurologic deficits are appreciated. Skin:  Skin is warm, dry and intact. No rash noted. Psychiatric: Mood and affect are normal. Speech and behavior are normal.  ____________________________________________   LABS (all labs ordered are listed, but only abnormal  results are displayed)  Labs Reviewed - No data to display   PROCEDURES  Procedure(s) performed (including Critical Care):  Procedures   ____________________________________________   INITIAL IMPRESSION / ASSESSMENT AND PLAN / ED COURSE       25 year old male with no significant past medical history presents to the ED complaining of bilateral throbbing headache gradually worsening over the past 2 days, starting in his temples and radiating backwards in a bandlike distribution.  Symptoms sound most consistent with a tension headache, no findings to suggest SAH or meningitis.  I would also consider viral syndrome given patient's fatigue and lower back discomfort.  There are no findings to suggest cauda equina, patient neurovascular intact to bilateral lower extremities.  Vital signs are reassuring and patient denies any difficulty breathing.  He is appropriate for discharge home and we will treat symptomatically with IM Toradol, patient counseled to continue anti-inflammatories at home.  He was counseled to return to the ED for new worsening symptoms, patient agrees with plan.      ____________________________________________   FINAL CLINICAL IMPRESSION(S) / ED DIAGNOSES  Final diagnoses:  Tension headache     ED Discharge Orders         Ordered    naproxen (NAPROSYN) 500 MG tablet  2 times daily with meals        09/26/20 1132           Note:  This document was prepared using Dragon voice recognition software and may include unintentional dictation errors.   Chesley Noon, MD 09/26/20 1135

## 2021-11-21 ENCOUNTER — Emergency Department (HOSPITAL_COMMUNITY): Payer: Self-pay

## 2021-11-21 ENCOUNTER — Emergency Department (HOSPITAL_COMMUNITY)
Admission: EM | Admit: 2021-11-21 | Discharge: 2021-11-21 | Disposition: A | Discharge status: Home / Self Care | Payer: Self-pay | Attending: Emergency Medicine | Admitting: Emergency Medicine

## 2021-11-21 ENCOUNTER — Encounter (HOSPITAL_COMMUNITY): Payer: Self-pay | Admitting: Emergency Medicine

## 2021-11-21 ENCOUNTER — Other Ambulatory Visit: Payer: Self-pay

## 2021-11-21 DIAGNOSIS — R519 Headache, unspecified: Secondary | ICD-10-CM | POA: Insufficient documentation

## 2021-11-21 DIAGNOSIS — R110 Nausea: Secondary | ICD-10-CM | POA: Insufficient documentation

## 2021-11-21 LAB — CBC WITH DIFFERENTIAL/PLATELET
Abs Immature Granulocytes: 0.03 10*3/uL (ref 0.00–0.07)
Basophils Absolute: 0.1 10*3/uL (ref 0.0–0.1)
Basophils Relative: 1 %
Eosinophils Absolute: 0.2 10*3/uL (ref 0.0–0.5)
Eosinophils Relative: 2 %
HCT: 48.4 % (ref 39.0–52.0)
Hemoglobin: 16.7 g/dL (ref 13.0–17.0)
Immature Granulocytes: 0 %
Lymphocytes Relative: 17 %
Lymphs Abs: 2.1 10*3/uL (ref 0.7–4.0)
MCH: 28.8 pg (ref 26.0–34.0)
MCHC: 34.5 g/dL (ref 30.0–36.0)
MCV: 83.6 fL (ref 80.0–100.0)
Monocytes Absolute: 0.7 10*3/uL (ref 0.1–1.0)
Monocytes Relative: 6 %
Neutro Abs: 9 10*3/uL — ABNORMAL HIGH (ref 1.7–7.7)
Neutrophils Relative %: 74 %
Platelets: 266 10*3/uL (ref 150–400)
RBC: 5.79 MIL/uL (ref 4.22–5.81)
RDW: 12.3 % (ref 11.5–15.5)
WBC: 12.1 10*3/uL — ABNORMAL HIGH (ref 4.0–10.5)
nRBC: 0 % (ref 0.0–0.2)

## 2021-11-21 LAB — BASIC METABOLIC PANEL
Anion gap: 8 (ref 5–15)
BUN: 14 mg/dL (ref 6–20)
CO2: 28 mmol/L (ref 22–32)
Calcium: 9 mg/dL (ref 8.9–10.3)
Chloride: 102 mmol/L (ref 98–111)
Creatinine, Ser: 1.21 mg/dL (ref 0.61–1.24)
GFR, Estimated: >60 mL/min (ref >60)
Glucose, Bld: 107 mg/dL — ABNORMAL HIGH (ref 70–99)
Potassium: 3.7 mmol/L (ref 3.5–5.1)
Sodium: 138 mmol/L (ref 135–145)

## 2021-11-21 MED ORDER — KETOROLAC TROMETHAMINE 30 MG/ML IJ SOLN
15.0000 mg | Freq: Once | INTRAMUSCULAR | Status: AC
  Administered 2021-11-21: 15 mg via INTRAVENOUS
  Filled 2021-11-21: qty 1

## 2021-11-21 MED ORDER — DIPHENHYDRAMINE HCL 50 MG/ML IJ SOLN
50.0000 mg | Freq: Once | INTRAMUSCULAR | Status: AC
  Administered 2021-11-21: 50 mg via INTRAVENOUS
  Filled 2021-11-21: qty 1

## 2021-11-21 MED ORDER — DEXAMETHASONE SODIUM PHOSPHATE 10 MG/ML IJ SOLN
10.0000 mg | Freq: Once | INTRAMUSCULAR | Status: AC
  Administered 2021-11-21: 10 mg via INTRAVENOUS
  Filled 2021-11-21: qty 1

## 2021-11-21 MED ORDER — SODIUM CHLORIDE 0.9 % IV BOLUS
1000.0000 mL | Freq: Once | INTRAVENOUS | Status: AC
  Administered 2021-11-21: 1000 mL via INTRAVENOUS

## 2021-11-21 MED ORDER — PROCHLORPERAZINE EDISYLATE 10 MG/2ML IJ SOLN
10.0000 mg | Freq: Once | INTRAMUSCULAR | Status: AC
  Administered 2021-11-21: 10 mg via INTRAVENOUS
  Filled 2021-11-21: qty 2

## 2021-11-21 NOTE — ED Provider Notes (Signed)
[facility] EMERGENCY DEPARTMENT Provider Note   CSN: [id number] Arrival date & time: 11/21/21  0147     History  Chief Complaint  Patient presents with   Headache    Stephen [name] is a 26 y.o. male.  Patient presents for bad headache.  Symptoms have been present for a couple of days.  Patient has had persistent headache across the front of his head accompanied by nausea, light sensitivity.  He has had similar headaches in the past but has never had a real work-up for them.       Home Medications Prior to Admission medications   Not on File      Allergies    Patient has no known allergies.    Review of Systems   Review of Systems  Physical Exam Updated Vital Signs BP 128/82   Pulse 81   Temp 98.4 F (36.9 C)   Resp 16   Ht 6' (1.829 m)   Wt 69.9 kg   SpO2 97%   BMI 20.89 kg/m  Physical Exam Vitals and nursing note reviewed.  Constitutional:      General: He is not in acute distress.    Appearance: He is well-developed.  HENT:     Head: Normocephalic and atraumatic.     Mouth/Throat:     Mouth: Mucous membranes are moist.  Eyes:     General: Vision grossly intact. Gaze aligned appropriately.     Extraocular Movements: Extraocular movements intact.     Conjunctiva/sclera: Conjunctivae normal.  Cardiovascular:     Rate and Rhythm: Normal rate and regular rhythm.     Pulses: Normal pulses.     Heart sounds: Normal heart sounds, S1 normal and S2 normal. No murmur heard.    No friction rub. No gallop.  Pulmonary:     Effort: Pulmonary effort is normal. No respiratory distress.     Breath sounds: Normal breath sounds.  Abdominal:     Palpations: Abdomen is soft.     Tenderness: There is no abdominal tenderness. There is no guarding or rebound.     Hernia: No hernia is present.  Musculoskeletal:        General: No swelling.     Cervical back: Full passive range of motion without pain, normal range of motion and neck supple. No pain with movement,  spinous process tenderness or muscular tenderness. Normal range of motion.     Right lower leg: No edema.     Left lower leg: No edema.  Skin:    General: Skin is warm and dry.     Capillary Refill: Capillary refill takes less than 2 seconds.     Findings: No ecchymosis, erythema, lesion or wound.  Neurological:     Mental Status: He is alert and oriented to person, place, and time.     GCS: GCS eye subscore is 4. GCS verbal subscore is 5. GCS motor subscore is 6.     Cranial Nerves: Cranial nerves 2-12 are intact.     Sensory: Sensation is intact.     Motor: Motor function is intact. No weakness or abnormal muscle tone.     Coordination: Coordination is intact.     Comments: Extraocular muscle movement: normal No visual field cut Pupils: equal and reactive both direct and consensual response is normal No nystagmus present    Sensory function is intact to light touch, pinprick Proprioception intact  Grip strength 5/5 symmetric in upper extremities No pronator drift Normal finger to nose  bilaterally  Lower extremity strength 5/5 against gravity Normal heel to shin bilaterally  Gait: normal   Psychiatric:        Mood and Affect: Mood normal.        Speech: Speech normal.        Behavior: Behavior normal.     ED Results / Procedures / Treatments   Labs (all labs ordered are listed, but only abnormal results are displayed) Labs Reviewed  CBC WITH DIFFERENTIAL/PLATELET - Abnormal; Notable for the following components:      Result Value   WBC 12.1 (*)    Neutro Abs 9.0 (*)    All other components within normal limits  BASIC METABOLIC PANEL    EKG None  Radiology CT HEAD WO CONTRAST ( )  Result Date: 11/21/2021 CLINICAL DATA:  Headache. EXAM: CT HEAD WITHOUT CONTRAST TECHNIQUE: Contiguous axial images were obtained from the base of the skull through the vertex without intravenous contrast. RADIATION DOSE REDUCTION: This exam was performed according to the  departmental dose-optimization program which includes automated exposure control, adjustment of the mA and/or kV according to patient size and/or use of iterative reconstruction technique. COMPARISON:  Head CT dated 01/18/2005. FINDINGS: Brain: The ventricles and sulci are appropriate size for the patient's age. The gray-white matter discrimination is preserved. There is no acute intracranial hemorrhage. No mass effect or midline shift. No extra-axial fluid collection. Vascular: No hyperdense vessel or unexpected calcification. Skull: Normal. Negative for fracture or focal lesion. Sinuses/Orbits: Diffuse mucoperiosteal thickening of paranasal sinuses. No air-fluid level. The mastoid air cells are clear. There is age indeterminate compression fracture of the medial wall the right orbit. Other: None IMPRESSION: 1. No acute intracranial pathology. 2. Age-indeterminate compression fracture of the medial wall of the right orbit. Electronically Signed   By: Elgie Collard M.D.   On: 11/21/2021 03:10    Procedures Procedures    Medications Ordered in ED Medications  ketorolac (TORADOL) 30 MG/ML injection 15 mg (15 mg Intravenous Given 11/21/21 0237)  dexamethasone (DECADRON) injection 10 mg (10 mg Intravenous Given 11/21/21 0241)  prochlorperazine (COMPAZINE) injection 10 mg (10 mg Intravenous Given 11/21/21 0235)  diphenhydrAMINE (BENADRYL) injection 50 mg (50 mg Intravenous Given 11/21/21 0239)  sodium chloride 0.9 % bolus 1,000 mL (1,000 mLs Intravenous New Bag/Given 11/21/21 0244)    ED Course/ Medical Decision Making/ A&P                           Medical Decision Making Amount and/or Complexity of Data Reviewed Labs: ordered. Radiology: ordered.  Risk Prescription drug management.   Patient presents with headache.  Patient with light sensitivity, nausea, bifrontal headache.  He does report a history of recurrent headaches but has never had a formal work-up.  CT head does not show any acute  pathology.  Patient treated with migraine cocktail with improvement.  Will be discharged, follow-up as an outpatient as needed.        Final Clinical Impression(s) / ED Diagnoses Final diagnoses:  Bad headache    Rx / DC Orders ED Discharge Orders     None         Starlette Thurow, Canary Brim, MD 11/21/21 [id number]

## 2021-11-21 NOTE — ED Triage Notes (Signed)
Pt c/o headache with nausea for a couple of days.

## 2022-07-22 ENCOUNTER — Other Ambulatory Visit: Payer: Medicaid Other

## 2022-07-30 ENCOUNTER — Ambulatory Visit: Payer: Medicaid Other | Admitting: Family Medicine

## 2022-08-02 ENCOUNTER — Encounter: Payer: Self-pay | Admitting: Family Medicine

## 2023-04-04 ENCOUNTER — Emergency Department (HOSPITAL_COMMUNITY)
Admission: EM | Admit: 2023-04-04 | Discharge: 2023-04-04 | Discharge status: Against Medical Advice | Payer: Medicaid Other | Attending: Emergency Medicine | Admitting: Emergency Medicine

## 2023-04-04 ENCOUNTER — Encounter (HOSPITAL_COMMUNITY): Payer: Self-pay

## 2023-04-04 ENCOUNTER — Other Ambulatory Visit: Payer: Self-pay

## 2023-04-04 DIAGNOSIS — H9201 Otalgia, right ear: Secondary | ICD-10-CM | POA: Diagnosis present

## 2023-04-04 DIAGNOSIS — Z5321 Procedure and treatment not carried out due to patient leaving prior to being seen by health care provider: Secondary | ICD-10-CM | POA: Insufficient documentation

## 2023-04-04 NOTE — ED Triage Notes (Signed)
Pt reports right ear pain x3-4 days.  ?

## 2024-03-28 ENCOUNTER — Emergency Department (HOSPITAL_COMMUNITY)
Admission: EM | Admit: 2024-03-28 | Discharge: 2024-03-29 | Disposition: A | Attending: Emergency Medicine | Admitting: Emergency Medicine

## 2024-03-28 ENCOUNTER — Other Ambulatory Visit: Payer: Self-pay

## 2024-03-28 ENCOUNTER — Encounter (HOSPITAL_COMMUNITY): Payer: Self-pay

## 2024-03-28 DIAGNOSIS — K529 Noninfective gastroenteritis and colitis, unspecified: Secondary | ICD-10-CM

## 2024-03-28 LAB — CBC
HCT: 49.7 % (ref 39.0–52.0)
Hemoglobin: 16.5 g/dL (ref 13.0–17.0)
MCH: 28.1 pg (ref 26.0–34.0)
MCHC: 33.2 g/dL (ref 30.0–36.0)
MCV: 84.5 fL (ref 80.0–100.0)
Platelets: 268 K/uL (ref 150–400)
RBC: 5.88 MIL/uL — ABNORMAL HIGH (ref 4.22–5.81)
RDW: 13.2 % (ref 11.5–15.5)
WBC: 15.1 K/uL — ABNORMAL HIGH (ref 4.0–10.5)
nRBC: 0 % (ref 0.0–0.2)

## 2024-03-28 LAB — COMPREHENSIVE METABOLIC PANEL WITH GFR
ALT: 75 U/L — ABNORMAL HIGH (ref 0–44)
AST: 45 U/L — ABNORMAL HIGH (ref 15–41)
Albumin: 4.7 g/dL (ref 3.5–5.0)
Alkaline Phosphatase: 87 U/L (ref 38–126)
Anion gap: 10 (ref 5–15)
BUN: 9 mg/dL (ref 6–20)
CO2: 26 mmol/L (ref 22–32)
Calcium: 9.2 mg/dL (ref 8.9–10.3)
Chloride: 104 mmol/L (ref 98–111)
Creatinine, Ser: 1 mg/dL (ref 0.61–1.24)
GFR, Estimated: >60 mL/min (ref >60)
Glucose, Bld: 122 mg/dL — ABNORMAL HIGH (ref 70–99)
Potassium: 4 mmol/L (ref 3.5–5.1)
Sodium: 139 mmol/L (ref 135–145)
Total Bilirubin: 0.9 mg/dL (ref 0.0–1.2)
Total Protein: 8.1 g/dL (ref 6.5–8.1)

## 2024-03-28 LAB — LIPASE, BLOOD: Lipase: 28 U/L (ref 11–51)

## 2024-03-28 MED ORDER — ONDANSETRON HCL 4 MG/2ML IJ SOLN
4.0000 mg | Freq: Once | INTRAMUSCULAR | Status: AC
  Administered 2024-03-28: 4 mg via INTRAVENOUS
  Filled 2024-03-28: qty 2

## 2024-03-28 MED ORDER — KETOROLAC TROMETHAMINE 30 MG/ML IJ SOLN
30.0000 mg | Freq: Once | INTRAMUSCULAR | Status: AC
  Administered 2024-03-28: 30 mg via INTRAVENOUS
  Filled 2024-03-28: qty 1

## 2024-03-28 MED ORDER — LACTATED RINGERS IV BOLUS
1000.0000 mL | Freq: Once | INTRAVENOUS | Status: AC
  Administered 2024-03-28: 1000 mL via INTRAVENOUS

## 2024-03-28 MED ORDER — ONDANSETRON 4 MG PO TBDP: 4.0000 mg | ORAL_TABLET | Freq: Once | ORAL | Status: DC

## 2024-03-28 NOTE — ED Triage Notes (Signed)
 Pt to ED from home with c/o N/V/D that started today, pt can't keep anything down.

## 2024-03-28 NOTE — ED Provider Notes (Signed)
 [name] Provider Note   CSN: [id number] Arrival date & time: 03/28/24  2214     Patient presents with: N/V/D   Stephen [name] is a 28 y.o. male.  {Add pertinent medical, surgical, social history, OB history to HPI:32947} Patient is a 28 year old male with no significant past medical history.  Patient presenting for evaluation of nausea, vomiting, and diarrhea.  Symptoms began earlier this afternoon.  He reports being unable to keep anything down.  No fevers or chills.  He has some generalized abdominal cramping with his vomiting and diarrhea, but denies any localized pain.  He denies any ill contacts and denies having consumed any undercooked or suspicious foods.       Prior to Admission medications   Not on File    Allergies: Patient has no known allergies.    Review of Systems  All other systems reviewed and are negative.   Updated Vital Signs BP 121/86   Pulse 95   Temp 98.9 F (37.2 C) (Oral)   Resp 18   Ht 5' 8 (1.727 m)   Wt 88.5 kg   SpO2 99%   BMI 29.65 kg/m   Physical Exam Vitals and nursing note reviewed.  Constitutional:      General: He is not in acute distress.    Appearance: He is well-developed. He is not diaphoretic.  HENT:     Head: Normocephalic and atraumatic.  Cardiovascular:     Rate and Rhythm: Normal rate and regular rhythm.     Heart sounds: No murmur heard.    No friction rub.  Pulmonary:     Effort: Pulmonary effort is normal. No respiratory distress.     Breath sounds: Normal breath sounds. No wheezing or rales.  Abdominal:     General: Bowel sounds are normal. There is no distension.     Palpations: Abdomen is soft.     Tenderness: There is no abdominal tenderness.  Musculoskeletal:        General: Normal range of motion.     Cervical back: Normal range of motion and neck supple.  Skin:    General: Skin is warm and dry.  Neurological:     Mental Status: He is alert and  oriented to person, place, and time.     Coordination: Coordination normal.     (all labs ordered are listed, but only abnormal results are displayed) Labs Reviewed  COMPREHENSIVE METABOLIC PANEL WITH GFR - Abnormal; Notable for the following components:      Result Value   Glucose, Bld 122 (*)    AST 45 (*)    ALT 75 (*)    All other components within normal limits  CBC - Abnormal; Notable for the following components:   WBC 15.1 (*)    RBC 5.88 (*)    All other components within normal limits  LIPASE, BLOOD    EKG: None  Radiology: No results found.  {Document cardiac monitor, telemetry assessment procedure when appropriate:32947} Procedures   Medications Ordered in the ED  ketorolac  (TORADOL ) 30 MG/ML injection 30 mg (has no administration in time range)  ondansetron  (ZOFRAN ) injection 4 mg (4 mg Intravenous Given 03/28/24 2257)  lactated ringers  bolus 1,000 mL (1,000 mLs Intravenous New Bag/Given 03/28/24 2257)      {Click here for ABCD2, HEART and other calculators REFRESH Note before signing:1}  Medical Decision Making Amount and/or Complexity of Data Reviewed Labs: ordered.  Risk Prescription drug management.   ***  {Document critical care time when appropriate  Document review of labs and clinical decision tools ie CHADS2VASC2, etc  Document your independent review of radiology images and any outside records  Document your discussion with family members, caretakers and with consultants  Document social determinants of health affecting pt's care  Document your decision making why or why not admission, treatments were needed:32947:::1}   Final diagnoses:  None    ED Discharge Orders     None

## 2024-03-29 MED ORDER — ONDANSETRON 8 MG PO TBDP: ORAL_TABLET | ORAL | 0 refills | Status: AC

## 2024-03-29 MED ORDER — ONDANSETRON 8 MG PO TBDP: ORAL_TABLET | ORAL | 0 refills | Status: DC

## 2024-03-29 NOTE — ED Notes (Signed)
 Pt tolerated oral hydration and snack crackers.

## 2024-03-29 NOTE — ED Notes (Signed)
 Discharge instructions reviewed.   Newly prescribed medications discussed. Pharmacy verified.   Opportunity for questions and concerns provided.   Alert, oriented and ambulatory.   Displays no signs of distress.

## 2024-03-29 NOTE — Discharge Instructions (Signed)
 Begin taking Zofran  as prescribed as needed for nausea.  Clear liquid diet for the next 12 hours, then slowly advance diet as tolerated.  Return to the emergency department if you develop worsening abdominal pain, high fevers, bloody stools, or for other new and concerning symptoms.
